# Patient Record
Sex: Female | Born: 1970 | Hispanic: Yes | Marital: Married | Smoking: Never smoker
Health system: Southern US, Community
[De-identification: ages and names within clinical notes are randomized; demographics above are authoritative.]

---

## 2003-10-24 ENCOUNTER — Emergency Department (HOSPITAL_COMMUNITY): Admission: EM | Admit: 2003-10-24 | Discharge: 2003-10-24 | Payer: Self-pay | Admitting: Emergency Medicine

## 2004-01-31 ENCOUNTER — Ambulatory Visit (HOSPITAL_COMMUNITY): Admission: RE | Admit: 2004-01-31 | Discharge: 2004-01-31 | Payer: Self-pay | Admitting: General Surgery

## 2005-09-06 ENCOUNTER — Ambulatory Visit (HOSPITAL_COMMUNITY): Admission: RE | Admit: 2005-09-06 | Discharge: 2005-09-06 | Payer: Self-pay | Admitting: *Deleted

## 2005-12-26 ENCOUNTER — Inpatient Hospital Stay (HOSPITAL_COMMUNITY): Admission: RE | Admit: 2005-12-26 | Discharge: 2005-12-28 | Payer: Self-pay | Admitting: *Deleted

## 2006-01-14 ENCOUNTER — Emergency Department (HOSPITAL_COMMUNITY): Admission: EM | Admit: 2006-01-14 | Discharge: 2006-01-14 | Payer: Self-pay | Admitting: Emergency Medicine

## 2007-07-28 IMAGING — CT CT ANGIO CHEST
2 of 5 series · 18 of 36 positions shown · IV contrast (omnipaque)
Comparison: None.
COMPARISON: None.

CLINICAL DATA: Elevated d-dimer. Left lower extremity pain.

CT ANGIOGRAPHY OF CHEST ( PULMONARY EMBOLISM PROTOCOL)  01/14/2006:
TECHNIQUE: Multidetector CT imaging of the chest was performed according to the
protocol for detection of pulmonary embolism during bolus injection of
intravenous contrast.  Coronal and sagittal plane CT angiographic image
reconstructions were also generated.
Contrast:  150 cc Omnipaque 300
TECHNIQUE: Delayed images through the pelvis and lower extremities to just below
the knees was performed after allowing the intravenous contrast to reach
equilibrium.
Contrast:  150 cc Amnipaque-U44.

[Series 9480: — · axial · 0.71mm/px · z∈[+1582,+1782]mm · 15 of 372 slices shown (1 of 2)]
[im 19/372  lung]
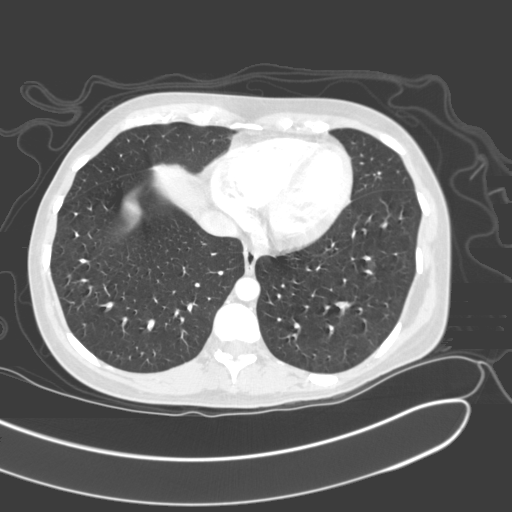
[im 38/372  mediastinal]
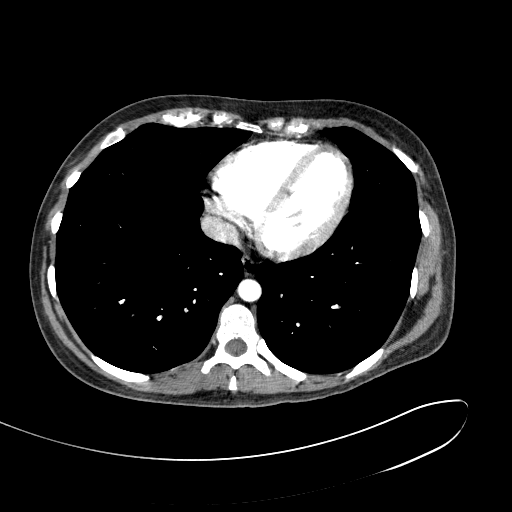
[im 75/372  lung]
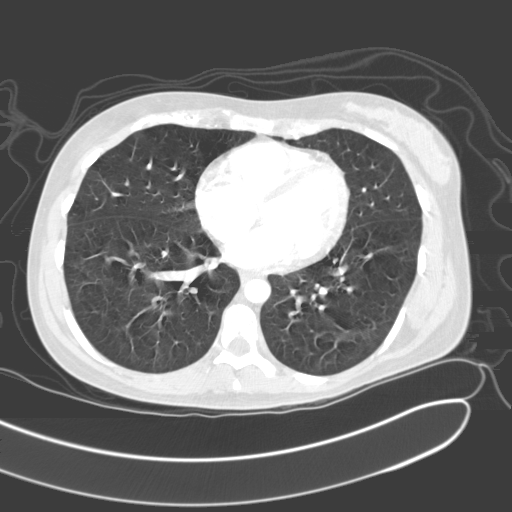
[im 93/372  mediastinal]
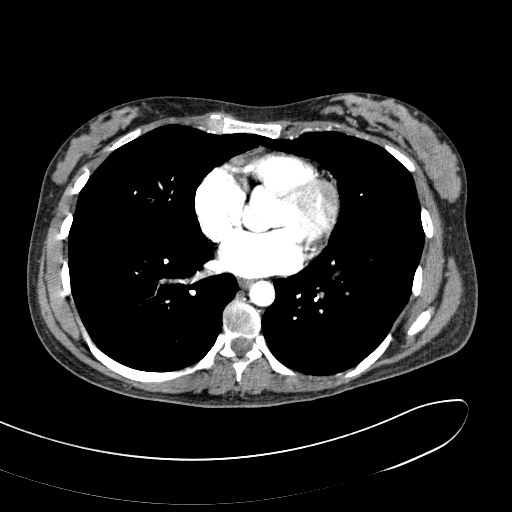
[im 112/372  lung]
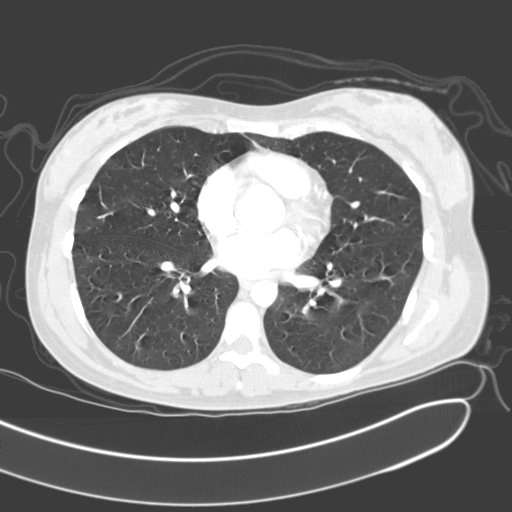
[im 130/372  mediastinal]
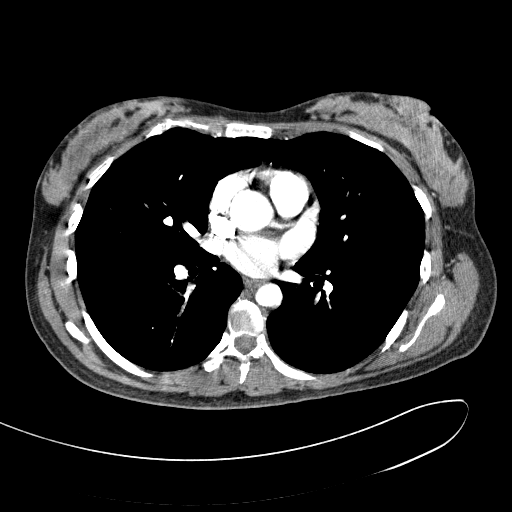
[im 167/372  lung]
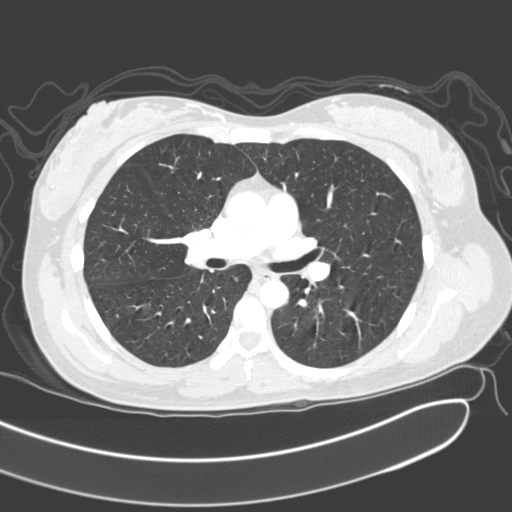
[im 186/372  mediastinal]
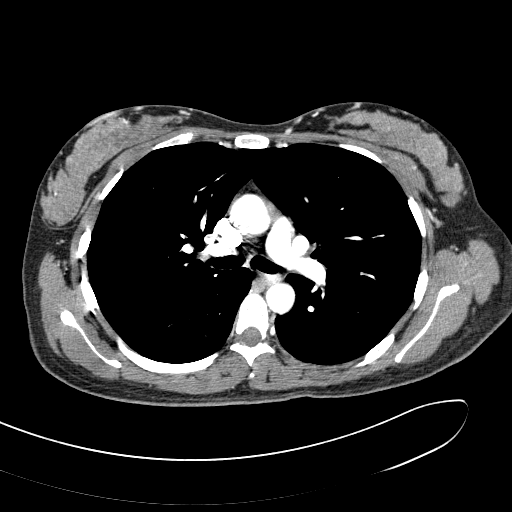
[im 205/372  lung]
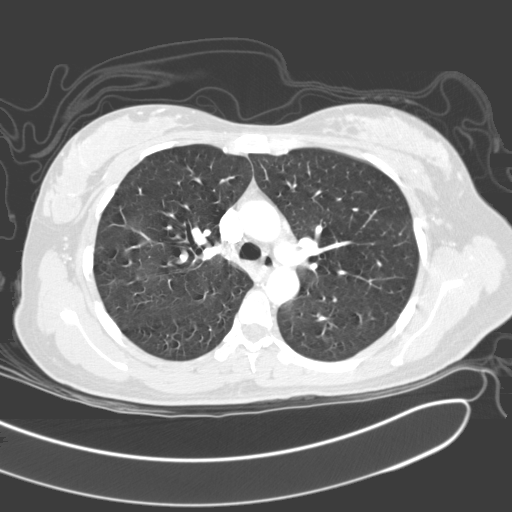
[im 242/372  mediastinal]
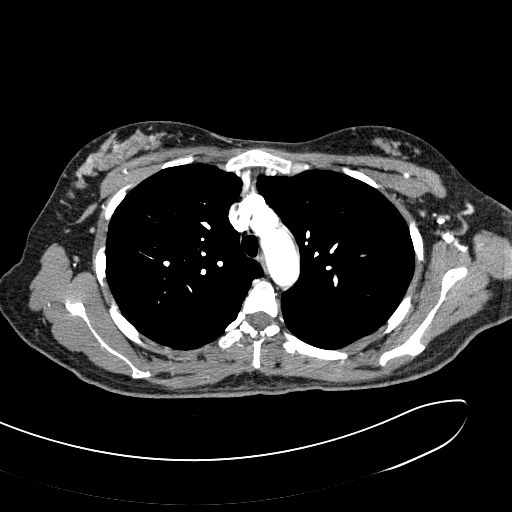
[im 260/372  lung]
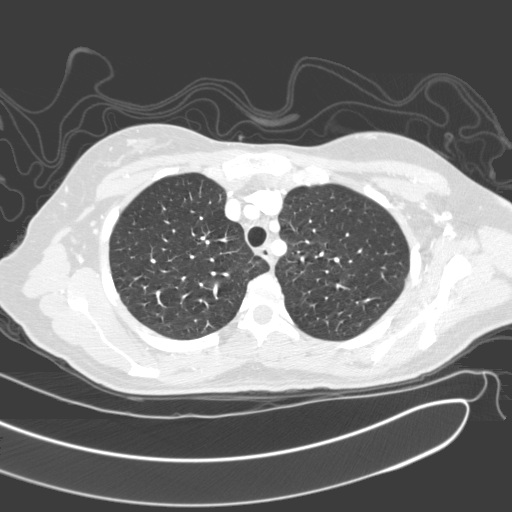
[im 279/372  mediastinal]
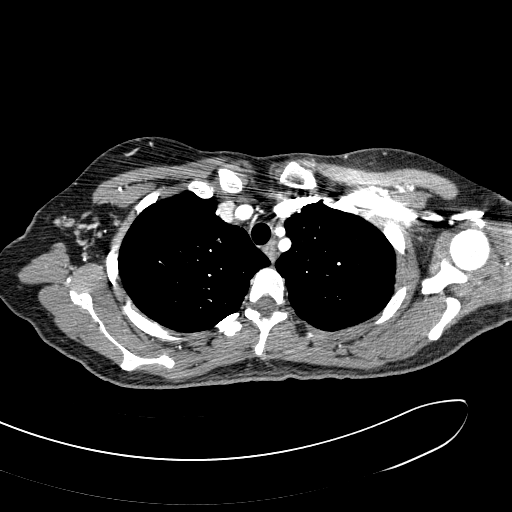
[im 297/372  lung]
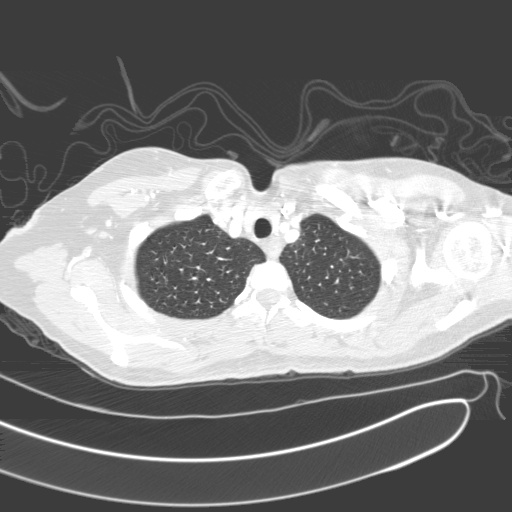
[im 334/372  mediastinal]
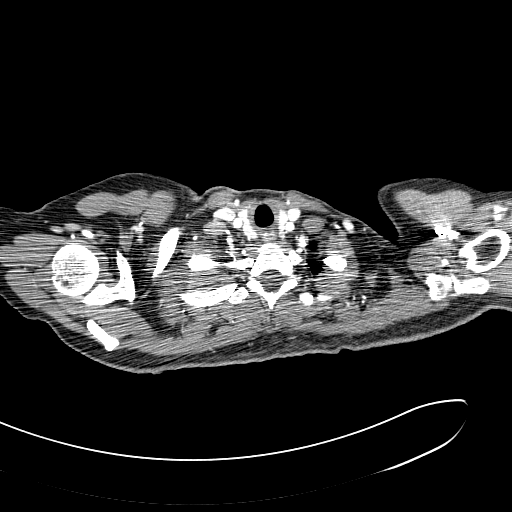
[im 353/372  lung]
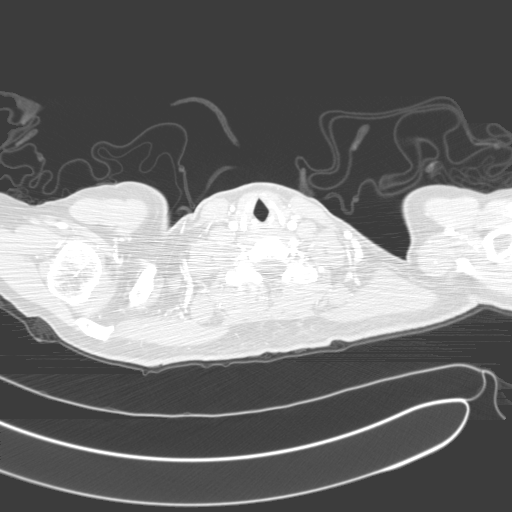

[— · coronal · 0.51mm/px · 3 of 66 slices shown (2 of 2)]
[im 14/66  mediastinal]
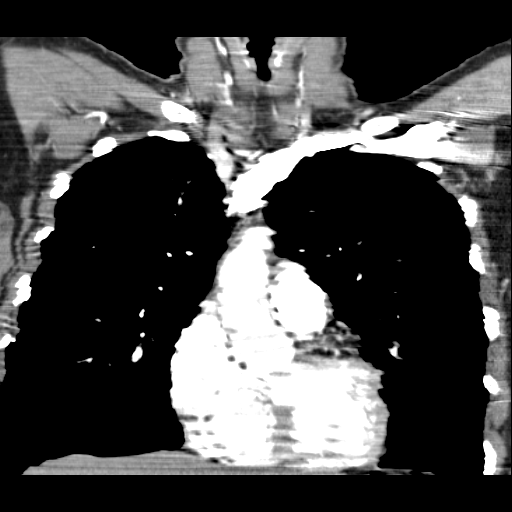
[im 27/66  mediastinal]
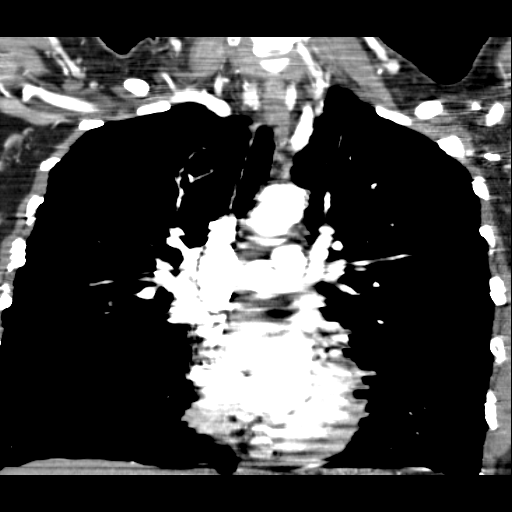
[im 40/66  mediastinal]
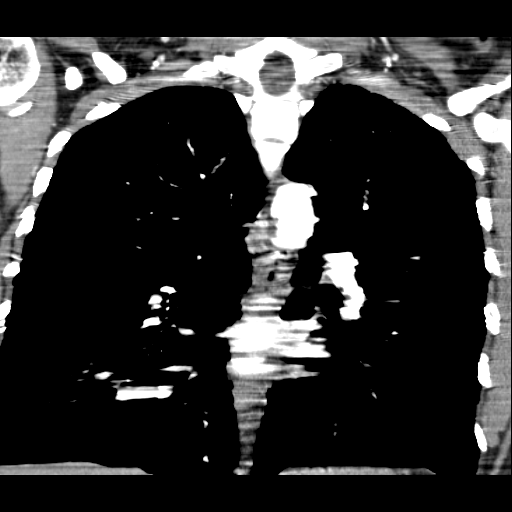

[18 of 36 positions shown; findings below may reference images not displayed]

FINDINGS: Contrast opacification the pulmonary arteries is excellent. However,
patient respiratory motion blurs the images of the lung bases, therefore the
study is of moderate diagnostic quality. No filling defects are identified in
either main pulmonary artery or their central branches in either lung to suggest
pulmonary embolism. The thoracic aorta and visualized upper abdominal aorta are
normal in appearance. Note is made of a bovine aortic arch (left common carotid
artery arising from the innominate artery). The heart size is normal. There is
no pericardial effusion. There are no pleural effusions. There is no significant
lymphadenopathy. The pulmonary parenchyma appears clear.
IMPRESSION: 1. No evidence of central pulmonary embolism. The images are degraded by patient
respiratory motion.
2. No acute cardiopulmonary disease. 

CT OF THE LOWER EXTREMITIES WITH CONTRAST (DVT PROTOCOL)  01/14/2006:
FINDINGS: There is suggestion of a filling defect within the left popliteal
vein beginning just below the knee. This does not extend proximally into the
superficial femoral vein. I do not see evidence for DVT elsewhere. The iliac
veins and lower SVC are widely patent. Note is made on the images of the pelvis
of the fact that there is a large fundal uterine fibroid measuring on the order
of 7 x 5 cm.
IMPRESSION: 1. Possible DVT involving the left popliteal vein. I would confirm this with
ultrasound prior to subjecting the patient to long-term anticoagulation.
2. No evidence of DVT elsewhere.
3. Large fundal uterine fibroid.

## 2010-10-21 ENCOUNTER — Encounter: Payer: Self-pay | Admitting: Emergency Medicine

## 2019-12-11 ENCOUNTER — Ambulatory Visit: Payer: Self-pay | Attending: Internal Medicine

## 2019-12-11 DIAGNOSIS — Z23 Encounter for immunization: Secondary | ICD-10-CM

## 2019-12-11 NOTE — Progress Notes (Signed)
   Covid-[facility] Vaccination [facility]  Name:  Nina Watson    MRN: [id number] DOB: [birth year]  12/11/2019  [name] was observed post Covid-19 immunization for 15 minutes without incident. She was provided with Vaccine Information Sheet and instruction to access the V-Safe system.   [name] was instructed to call 911 with any severe reactions post vaccine: Marland Kitchen Difficulty breathing  . Swelling of face and throat  . A fast heartbeat  . A bad rash all over body  . Dizziness and weakness   Immunizations Administered    Name Date Dose VIS Date Route   Moderna COVID-19 Vaccine 12/11/2019  2:39 PM 0.5 mL 08/31/2019 Intramuscular   Manufacturer: Moderna   Lot: [id number]   NDC: 83014-159-73

## 2020-01-12 ENCOUNTER — Ambulatory Visit: Payer: Self-pay | Attending: Internal Medicine

## 2020-01-12 DIAGNOSIS — Z23 Encounter for immunization: Secondary | ICD-10-CM

## 2020-01-12 NOTE — Progress Notes (Signed)
   Covid-19 Vaccination Clinic  Name:  Nina Watson    MRN: [id number] DOB: March 03, 1971  01/12/2020  [name] was observed post Covid-19 immunization for 15 minutes without incident. She was provided with Vaccine Information Sheet and instruction to access the V-Safe system.   [name] was instructed to call 911 with any severe reactions post vaccine: Marland Kitchen Difficulty breathing  . Swelling of face and throat  . A fast heartbeat  . A bad rash all over body  . Dizziness and weakness   Immunizations Administered    Name Date Dose VIS Date Route   Moderna COVID-19 Vaccine 01/12/2020  2:38 PM 0.5 mL 08/31/2019 Intramuscular   Manufacturer: Moderna   Lot: [id number]   NDC: 84859-276-39

## 2020-10-03 ENCOUNTER — Encounter: Payer: Self-pay | Admitting: Adult Health

## 2022-03-26 ENCOUNTER — Ambulatory Visit
Admission: EM | Admit: 2022-03-26 | Discharge: 2022-03-26 | Disposition: A | Discharge status: Home / Self Care | Payer: Self-pay

## 2022-03-26 DIAGNOSIS — H01001 Unspecified blepharitis right upper eyelid: Secondary | ICD-10-CM

## 2022-03-26 MED ORDER — ERYTHROMYCIN 5 MG/GM OP OINT: TOPICAL_OINTMENT | OPHTHALMIC | 0 refills | Status: AC
